# Patient Record
Sex: Male | Born: 2005 | Race: White | Hispanic: No | Marital: Single | Smoking: Never smoker
Health system: Southern US, Community
[De-identification: ages and names within clinical notes are randomized; demographics above are authoritative.]

## PROBLEM LIST (undated history)

## (undated) DIAGNOSIS — G4730 Sleep apnea, unspecified: Secondary | ICD-10-CM

## (undated) HISTORY — PX: WISDOM TOOTH EXTRACTION: SHX21

---
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
## 2024-03-24 ENCOUNTER — Other Ambulatory Visit: Payer: Self-pay

## 2024-03-24 ENCOUNTER — Emergency Department (HOSPITAL_COMMUNITY)

## 2024-03-24 ENCOUNTER — Emergency Department (HOSPITAL_BASED_OUTPATIENT_CLINIC_OR_DEPARTMENT_OTHER)
Admission: EM | Admit: 2024-03-24 | Discharge: 2024-03-25 | Disposition: A | Discharge status: Home / Self Care | Attending: Emergency Medicine | Admitting: Emergency Medicine

## 2024-03-24 ENCOUNTER — Encounter (HOSPITAL_BASED_OUTPATIENT_CLINIC_OR_DEPARTMENT_OTHER): Payer: Self-pay

## 2024-03-24 DIAGNOSIS — N50811 Right testicular pain: Secondary | ICD-10-CM | POA: Insufficient documentation

## 2024-03-24 LAB — URINALYSIS, ROUTINE W REFLEX MICROSCOPIC
Bilirubin Urine: NEGATIVE
Glucose, UA: NEGATIVE mg/dL
Hgb urine dipstick: NEGATIVE
Ketones, ur: NEGATIVE mg/dL
Leukocytes,Ua: NEGATIVE
Nitrite: NEGATIVE
Protein, ur: NEGATIVE mg/dL
Specific Gravity, Urine: 1.025 (ref 1.005–1.030)
pH: 6 (ref 5.0–8.0)

## 2024-03-24 NOTE — ED Notes (Signed)
 Patient transported to Ultrasound

## 2024-03-24 NOTE — ED Notes (Signed)
 Pt has arrived, was ambulatory to the room and mentions the pain has somewhat resolved at this time

## 2024-03-24 NOTE — ED Provider Notes (Signed)
  Provider Note MRN:  [id number]  Arrival date & time: 03/25/24    ED Course and Medical Decision Making  Assumed care of patient at sign-out or upon transfer.  Here for testicular ultrasound to exclude torsion.  Had some sudden onset right testicular pain earlier this evening, seems to have improved quite a bit, not really having pain unless he touches the area.  Awaiting ultrasound.  12:30 AM update: Ultrasound reassuring, patient continues to be in no acute distress on reassessment.  Urinalysis without evidence of infection.  Appropriate for discharge.  Procedures  Final Clinical Impressions(s) / ED Diagnoses     ICD-10-CM   1. Right testicular pain  N50.811       ED Discharge Orders     None        Ozell HERO. Theadore, MD [facility] Emergency Medicine [facility] [email or web address]    Theadore Ozell HERO, MD 03/25/24 [phone]

## 2024-03-24 NOTE — ED Triage Notes (Addendum)
 Sx x tonight. Sudden onset mild right testicular pain. Denies pain at this time, but states that pain is intermittent and, at its worst, is a 3.5/10. Denies N/V/D/fever. Denies dysuria/hematuria. No acute distress noted in triage.

## 2024-03-24 NOTE — ED Provider Notes (Signed)
 [facility] Provider Note   CSN: [id number] Arrival date & time: 03/24/24  2127     Patient presents with: Testicle Pain   Dustin Jackson is a 18 y.o. male.   18 year old male with no past medical history presents to the ED with a chief complaint of sudden onset of right testicular pain.  He reports the pain was severe in nature however later dissipated.  He described the pain was about a 3 out of 10.  He did not take any medication for pain.  There is no alleviating or exacerbating factors.  He does not have any prior history of testicular torsion.  He denies being sexually active.  No nausea, vomiting, fevers. No penile discharge or drainage   The history is provided by the patient.  Testicle Pain       Prior to Admission medications   Medication Sig Start Date End Date Taking? Authorizing Provider  amoxicillin  (AMOXIL ) 500 MG capsule Take 1 capsule (500 mg total) by mouth 3 (three) times daily. 05/13/23   Silver Wonda LABOR, PA  azithromycin  (ZITHROMAX ) 250 MG tablet Take 1 tablet (250 mg total) by mouth daily. Take first 2 tablets together, then 1 every day until finished. 05/13/23   Silver Wonda LABOR, PA  ibuprofen  (ADVIL ) 600 MG tablet Take 1 tablet (600 mg total) by mouth every 6 (six) hours as needed. 12/18/22   Ladora Congress, PA  ondansetron  (ZOFRAN ) 4 MG tablet Take 1 tablet (4 mg total) by mouth every 6 (six) hours. 12/11/21   Emelia Sluder, PA-C    Allergies: Patient has no known allergies.    Review of Systems  Constitutional:  Negative for fever.  Genitourinary:  Positive for testicular pain. Negative for difficulty urinating and dysuria.    Updated Vital Signs BP 120/79 (BP Location: Right Arm)   Pulse 92   Temp 98.9 F (37.2 C) (Oral)   Resp 16   Ht 5' 10 (1.778 m)   Wt 72.6 kg   SpO2 97%   BMI 22.96 kg/m   Physical Exam Vitals and nursing note reviewed. Exam conducted with a chaperone present.  Constitutional:       Appearance: Normal appearance.  HENT:     Head: Normocephalic and atraumatic.     Mouth/Throat:     Mouth: Mucous membranes are moist.  Cardiovascular:     Rate and Rhythm: Normal rate.  Pulmonary:     Effort: Pulmonary effort is normal.  Abdominal:     General: Abdomen is flat.  Genitourinary:    Testes:        Right: Tenderness present.        Left: Tenderness not present.     Comments: Tenderness to palpation along the right testicle.  No swelling, no erythema noted.  From by Alyce PEAK.  No rash, no penile discharge or drainage. Musculoskeletal:     Cervical back: Normal range of motion and neck supple.  Skin:    General: Skin is warm and dry.  Neurological:     Mental Status: He is alert and oriented to person, place, and time.     (all labs ordered are listed, but only abnormal results are displayed) Labs Reviewed  URINALYSIS, ROUTINE W REFLEX MICROSCOPIC    EKG: None  Radiology: No results found.   Procedures   Medications Ordered in the ED - No data to display  Medical Decision Making Amount and/or Complexity of Data Reviewed Labs: ordered.   Patient presents to ED with sudden onset of right testicular pain.  Reports the pain is about a 3 out of 10 now.  Has not taken any medication for improvement in symptoms.  Patient reports he is not sexually active accompanied by mother at the bedside.  UA without any infectious signs.  On exam no inguinal hernias noted.  No tenderness along the epididymis.  No penile drainage, no discharge, no rashes noted.  I discussed with patient that unfortunately we do not have ultrasound Abilities at this time.  Will have to transfer him over to [facility] in order to obtain ultrasound.  Did discuss this case with Dr. Patt, he is aware of patient going via POV.  Patient is hemodynamically stable for transfer.  Portions of this note were generated with Scientist, clinical (histocompatibility and immunogenetics). Dictation errors may  occur despite best attempts at proofreading.   Final diagnoses:  Right testicular pain    ED Discharge Orders     None          Maureen Broad, PA-C 03/24/24 2223    Lenor Hollering, MD 03/24/24 2316

## 2024-03-24 NOTE — Discharge Instructions (Signed)
 You were evaluated in the Emergency Department and after careful evaluation, we did not find any emergent condition requiring admission or further testing in the hospital.  Your exam/testing today is overall reassuring.  Ultrasound not show any emergencies.  Recommend scrotal support with briefs or boxer briefs as we discussed.  Recommend Tylenol  and Motrin  for discomfort, recommend follow-up with urology if pain does not go away over the next day or two.  Please return to the Emergency Department if you experience any worsening of your condition.   Thank you for allowing us  to be a part of your care.
